# Patient Record
Sex: Male | Born: 1975
Health system: Southern US, Community
[De-identification: ages and names within clinical notes are randomized; demographics above are authoritative.]

## PROBLEM LIST (undated history)

## (undated) HISTORY — PX: COLON SURGERY: SHX602

---

## 2009-12-29 ENCOUNTER — Encounter: Admission: RE | Admit: 2009-12-29 | Discharge: 2009-12-29 | Payer: Self-pay | Admitting: Internal Medicine
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
## 2013-04-22 ENCOUNTER — Encounter (HOSPITAL_COMMUNITY): Payer: Self-pay | Admitting: Emergency Medicine

## 2013-04-22 ENCOUNTER — Emergency Department (HOSPITAL_COMMUNITY)
Admission: EM | Admit: 2013-04-22 | Discharge: 2013-04-22 | Disposition: A | Discharge status: Home / Self Care | Payer: 59 | Attending: Emergency Medicine | Admitting: Emergency Medicine

## 2013-04-22 DIAGNOSIS — Y9389 Activity, other specified: Secondary | ICD-10-CM | POA: Insufficient documentation

## 2013-04-22 DIAGNOSIS — S335XXA Sprain of ligaments of lumbar spine, initial encounter: Principal | ICD-10-CM

## 2013-04-22 DIAGNOSIS — S39012A Strain of muscle, fascia and tendon of lower back, initial encounter: Secondary | ICD-10-CM

## 2013-04-22 DIAGNOSIS — Z87891 Personal history of nicotine dependence: Secondary | ICD-10-CM | POA: Insufficient documentation

## 2013-04-22 DIAGNOSIS — Y929 Unspecified place or not applicable: Secondary | ICD-10-CM | POA: Insufficient documentation

## 2013-04-22 DIAGNOSIS — S339XXA Sprain of unspecified parts of lumbar spine and pelvis, initial encounter: Secondary | ICD-10-CM | POA: Insufficient documentation

## 2013-04-22 DIAGNOSIS — X500XXA Overexertion from strenuous movement or load, initial encounter: Secondary | ICD-10-CM | POA: Insufficient documentation

## 2013-04-22 MED ORDER — KETOROLAC TROMETHAMINE 30 MG/ML IJ SOLN
30.0000 mg | Freq: Once | INTRAMUSCULAR | Status: AC
  Administered 2013-04-22: 30 mg via INTRAMUSCULAR
  Filled 2013-04-22: qty 1

## 2013-04-22 MED ORDER — OXYCODONE-ACETAMINOPHEN 5-325 MG PO TABS: 1.0000 | ORAL_TABLET | Freq: Four times a day (QID) | ORAL | Status: DC | PRN

## 2013-04-22 MED ORDER — IBUPROFEN 200 MG PO TABS: 600.0000 mg | ORAL_TABLET | Freq: Four times a day (QID) | ORAL | Status: AC | PRN

## 2013-04-22 MED ORDER — OXYCODONE-ACETAMINOPHEN 5-325 MG PO TABS
1.0000 | ORAL_TABLET | Freq: Once | ORAL | Status: AC
  Administered 2013-04-22: 1 via ORAL
  Filled 2013-04-22: qty 1

## 2013-04-22 NOTE — ED Notes (Signed)
Patient states he was putting on boots when he noticed an "electric pain" and had to stop.  States he has difficulty finding a comfortable position.  Denies bowel bladder problems and denies radiating pain down either leg.

## 2013-04-22 NOTE — Discharge Instructions (Signed)
Back Pain, Adult Low back pain is very common. About 1 in 5 people have back pain.The cause of low back pain is rarely dangerous. The pain often gets better over time.About half of people with a sudden onset of back pain feel better in just 2 weeks. About 8 in 10 people feel better by 6 weeks.  CAUSES Some common causes of back pain include:  Strain of the muscles or ligaments supporting the spine.  Wear and tear (degeneration) of the spinal discs.  Arthritis.  Direct injury to the back. DIAGNOSIS Most of the time, the direct cause of low back pain is not known.However, back pain can be treated effectively even when the exact cause of the pain is unknown.Answering your caregiver's questions about your overall health and symptoms is one of the most accurate ways to make sure the cause of your pain is not dangerous. If your caregiver needs more information, he or she may order lab work or imaging tests (X-rays or MRIs).However, even if imaging tests show changes in your back, this usually does not require surgery. HOME CARE INSTRUCTIONS For many people, back pain returns.Since low back pain is rarely dangerous, it is often a condition that people can learn to manageon their own.   Remain active. It is stressful on the back to sit or stand in one place. Do not sit, drive, or stand in one place for more than 30 minutes at a time. Take short walks on level surfaces as soon as pain allows.Try to increase the length of time you walk each day.  Do not stay in bed.Resting more than 1 or 2 days can delay your recovery.  Do not avoid exercise or work.Your body is made to move.It is not dangerous to be active, even though your back may hurt.Your back will likely heal faster if you return to being active before your pain is gone.  Pay attention to your body when you bend and lift. Many people have less discomfortwhen lifting if they bend their knees, keep the load close to their bodies,and  avoid twisting. Often, the most comfortable positions are those that put less stress on your recovering back.  Find a comfortable position to sleep. Use a firm mattress and lie on your side with your knees slightly bent. If you lie on your back, put a pillow under your knees.  Only take over-the-counter or prescription medicines as directed by your caregiver. Over-the-counter medicines to reduce pain and inflammation are often the most helpful.Your caregiver may prescribe muscle relaxant drugs.These medicines help dull your pain so you can more quickly return to your normal activities and healthy exercise.  Put ice on the injured area.  Put ice in a plastic bag.  Place a towel between your skin and the bag.  Leave the ice on for 15-20 minutes, 03-04 times a day for the first 2 to 3 days. After that, ice and heat may be alternated to reduce pain and spasms.  Ask your caregiver about trying back exercises and gentle massage. This may be of some benefit.  Avoid feeling anxious or stressed.Stress increases muscle tension and can worsen back pain.It is important to recognize when you are anxious or stressed and learn ways to manage it.Exercise is a great option. SEEK MEDICAL CARE IF:  You have pain that is not relieved with rest or medicine.  You have pain that does not improve in 1 week.  You have new symptoms.  You are generally not feeling well. SEEK   IMMEDIATE MEDICAL CARE IF:   You have pain that radiates from your back into your legs.  You develop new bowel or bladder control problems.  You have unusual weakness or numbness in your arms or legs.  You develop nausea or vomiting.  You develop abdominal pain.  You feel faint. Document Released: 03/26/2005 Document Revised: 09/25/2011 Document Reviewed: 08/14/2010 Exit[employer] Patient Information 2014 [employer]. Back Exercises Back exercises help treat and prevent back injuries. The goal of back exercises is to increase  the strength of your abdominal and back muscles and the flexibility of your back. These exercises should be started when you no longer have back pain. Back exercises include:  Pelvic Tilt. Lie on your back with your knees bent. Tilt your pelvis until the lower part of your back is against the floor. Hold this position 5 to 10 sec and repeat 5 to 10 times.  Knee to Chest. Pull first 1 knee up against your chest and hold for 20 to 30 seconds, repeat this with the other knee, and then both knees. This may be done with the other leg straight or bent, whichever feels better.  Sit-Ups or Curl-Ups. Bend your knees 90 degrees. Start with tilting your pelvis, and do a partial, slow sit-up, lifting your trunk only 30 to 45 degrees off the floor. Take at least 2 to 3 seconds for each sit-up. Do not do sit-ups with your knees out straight. If partial sit-ups are difficult, simply do the above but with only tightening your abdominal muscles and holding it as directed.  Hip-Lift. Lie on your back with your knees flexed 90 degrees. Push down with your feet and shoulders as you raise your hips a couple inches off the floor; hold for 10 seconds, repeat 5 to 10 times.  Back arches. Lie on your stomach, propping yourself up on bent elbows. Slowly press on your hands, causing an arch in your low back. Repeat 3 to 5 times. Any initial stiffness and discomfort should lessen with repetition over time.  Shoulder-Lifts. Lie face down with arms beside your body. Keep hips and torso pressed to floor as you slowly lift your head and shoulders off the floor. Do not overdo your exercises, especially in the beginning. Exercises may cause you some mild back discomfort which lasts for a few minutes; however, if the pain is more severe, or lasts for more than 15 minutes, do not continue exercises until you see your caregiver. Improvement with exercise therapy for back problems is slow.  See your caregivers for assistance with  developing a proper back exercise program. Document Released: 05/03/2004 Document Revised: 06/18/2011 Document Reviewed: 01/25/2011 Exit[employer] Patient Information 2014 [employer].  

## 2013-04-22 NOTE — ED Notes (Signed)
Dr. Horton at bedside. 

## 2013-04-22 NOTE — ED Notes (Signed)
Patient states he was putting on boots yesterday morning and noticed a sharp pain in the middle of his lower back around the belt line.  Patient was now has difficulty walking and with other normal activities.

## 2013-04-22 NOTE — ED Provider Notes (Signed)
CSN: [id number]     Arrival date & time 04/22/13  0702 History   First MD Initiated Contact with Patient 04/22/13 0710     Chief Complaint  Patient presents with  . Back Pain   (Consider location/radiation/quality/duration/timing/severity/associated sxs/prior Treatment) HPI  This a 38 year old male with no significant past medical history who presents with back pain. Patient states that he was pulling on his boots yesterday morning when he felt a sharp pain in his lower back. Since that time he has had worsening pain. The pain is worse with movement. He locates the pain over his lower back. He denies any radiation of pain. He denies any dark or bladder problems. He reports pain with ambulation but no difficulty ambulating.  He has taken ibuprofen without relief.  He denies any fevers, IV drug use, recent steroid use.  History reviewed. No pertinent past medical history. Past Surgical History  Procedure Laterality Date  . Colon surgery      when he was a neonate   No family history on file. History  Substance Use Topics  . Smoking status: Former Smoker    Types: Cigarettes  . Smokeless tobacco: Not on file  . Alcohol Use: No    Review of Systems  Constitutional: Negative.  Negative for fever.  Respiratory: Negative.  Negative for chest tightness and shortness of breath.   Cardiovascular: Negative.  Negative for chest pain.  Gastrointestinal: Negative.  Negative for abdominal pain.  Genitourinary: Negative.        Denies urinary retention  Musculoskeletal: Positive for back pain. Negative for gait problem.  Skin: Negative for rash.  Neurological: Negative for weakness, numbness and headaches.  All other systems reviewed and are negative.    Allergies  Codeine  Home Medications   Current Outpatient Rx  Name  Route  Sig  Dispense  Refill  . ibuprofen (ADVIL,MOTRIN) 200 MG tablet   Oral   Take 3 tablets (600 mg total) by mouth every 6 (six) hours as needed for headache or  cramping.   30 tablet   0   . oxyCODONE-acetaminophen (PERCOCET/ROXICET) 5-325 MG per tablet   Oral   Take 1 tablet by mouth every 6 (six) hours as needed for severe pain.   15 tablet   0    BP 130/80  Pulse 75  Temp(Src) 97.9 F (36.6 C) (Oral)  Resp 20  Ht 5\' 7"  (1.702 m)  Wt 185 lb (83.915 kg)  BMI 28.97 kg/m2  SpO2 100% Physical Exam  Nursing note and vitals reviewed. Constitutional: He is oriented to person, place, and time. No distress.  Uncomfortable appearing  HENT:  Head: Normocephalic and atraumatic.  Cardiovascular: Normal rate, regular rhythm and normal heart sounds.   No murmur heard. Pulmonary/Chest: Effort normal and breath sounds normal. No respiratory distress. He has no wheezes.  Abdominal: Soft. Bowel sounds are normal. There is no tenderness. There is no rebound.  Musculoskeletal: He exhibits no edema.  No midline tenderness to palpation  Lymphadenopathy:    He has no cervical adenopathy.  Neurological: He is alert and oriented to person, place, and time. He displays normal reflexes.  5 out of 5 strength in all 4 extremities  Skin: Skin is warm and dry.  Psychiatric: He has a normal mood and affect.    ED Course  Procedures (including critical care time) Labs Review Labs Reviewed - No data to display Imaging Review No results found.  EKG Interpretation   None  MDM   1. Lumbosacral strain    Patient presents with back pain. Patient had onset of pain while pulling on his speech yesterday morning. He is nontoxic-appearing. He has no red flags back pain. He is nontender on exam. Neurologic exam is normal.  Given that his pain is worse with movement, suspect lumbosacral strain.  Patient was given Percocet and Toradol. He was encouraged to continue ibuprofen at home and I will give him a short course of narcotic pain medication. He is to followup with his primary physician.  After history, exam, and medical workup I feel the patient has  been appropriately medically screened and is safe for discharge home. Pertinent diagnoses were discussed with the patient. Patient was given return precautions.     Shon Baton, MD 04/22/13 [phone]

## 2016-05-16 ENCOUNTER — Ambulatory Visit (HOSPITAL_COMMUNITY)
Admission: EM | Admit: 2016-05-16 | Discharge: 2016-05-16 | Disposition: A | Discharge status: Home / Self Care | Payer: Commercial Managed Care - HMO | Attending: Family Medicine | Admitting: Family Medicine

## 2016-05-16 ENCOUNTER — Encounter (HOSPITAL_COMMUNITY): Payer: Self-pay | Admitting: Family Medicine

## 2016-05-16 DIAGNOSIS — M10071 Idiopathic gout, right ankle and foot: Secondary | ICD-10-CM

## 2016-05-16 MED ORDER — INDOMETHACIN 50 MG PO CAPS: 50.0000 mg | ORAL_CAPSULE | Freq: Three times a day (TID) | ORAL | 3 refills | Status: AC

## 2016-05-16 MED ORDER — PREDNISONE 20 MG PO TABS: ORAL_TABLET | ORAL | 3 refills | Status: DC

## 2016-05-16 NOTE — ED Triage Notes (Signed)
Pt here for right big toe pain and swelling.

## 2016-05-16 NOTE — ED Provider Notes (Signed)
[facility]    CSN: [id number] Arrival date & time: 05/16/16  1105     History   Chief Complaint Chief Complaint  Patient presents with  . Toe Pain    HPI Clarence Small is a 41 y.o. male.   [name] with right great toe pain. The problem has been present for about 4 days. Is characterized by pain on ambulation, redness and swelling in the PIP joint of the right great toe  Patient works for [employer]  Father's and alcoholic  Family history is positive for rheumatoid arthritis      History reviewed. No pertinent past medical history.  There are no active problems to display for this patient.   Past Surgical History:  Procedure Laterality Date  . COLON SURGERY     when he was a neonate       Home Medications    Prior to Admission medications   Medication Sig Start Date End Date Taking? Authorizing Provider  ibuprofen (ADVIL,MOTRIN) 200 MG tablet Take 3 tablets (600 mg total) by mouth every 6 (six) hours as needed for headache or cramping. 04/22/13   Shon Batonourtney F Horton, MD  indomethacin (INDOCIN) 50 MG capsule Take 1 capsule (50 mg total) by mouth 3 (three) times daily with meals. 05/16/16   Elvina SidleKurt Brie Eppard, MD  predniSONE (DELTASONE) 20 MG tablet Two daily with food 05/16/16   Elvina SidleKurt Lella Mullany, MD    Family History History reviewed. No pertinent family history.  Social History Social History  Substance Use Topics  . Smoking status: Former Smoker    Types: Cigarettes  . Smokeless tobacco: Never Used  . Alcohol use No     Allergies   Codeine   Review of Systems Review of Systems  Constitutional: Negative.   Musculoskeletal: Positive for gait problem and joint swelling.     Physical Exam Triage Vital Signs ED Triage Vitals [05/16/16 1214]  Enc Vitals Group     BP 144/81     Pulse Rate 86     Resp 18     Temp 98.2 F (36.8 C)     Temp src      SpO2 98 %     Weight      Height      Head  Circumference      Peak Flow      Pain Score      Pain Loc      Pain Edu?      Excl. in GC?    No data found.   Updated Vital Signs BP 144/81   Pulse 86   Temp 98.2 F (36.8 C)   Resp 18   SpO2 98%    Physical Exam  Constitutional: He is oriented to person, place, and time. He appears well-developed and well-nourished.  HENT:  Right Ear: External ear normal.  Left Ear: External ear normal.  Mouth/Throat: Oropharynx is clear and moist.  Eyes: Conjunctivae are normal. Pupils are equal, round, and reactive to light.  Neck: Normal range of motion. Neck supple.  Pulmonary/Chest: Effort normal.  Musculoskeletal:  Right great toe erythema, swelling, tenderness  Neurological: He is alert and oriented to person, place, and time.  Skin: Skin is warm. There is erythema.  Nursing note and vitals reviewed.    UC Treatments / Results  Labs (all labs ordered are listed, but only abnormal results are displayed) Labs Reviewed - No data to display  EKG  EKG Interpretation None  Radiology No results found.  Procedures Procedures (including critical care time)  Medications Ordered in UC Medications - No data to display   Initial Impression / Assessment and Plan / UC Course  I have reviewed the triage vital signs and the nursing notes.  Pertinent labs & imaging results that were available during my care of the patient were reviewed by me and considered in my medical decision making (see chart for details).     Final Clinical Impressions(s) / UC Diagnoses   Final diagnoses:  Acute idiopathic gout involving toe of right foot    New Prescriptions New Prescriptions   INDOMETHACIN (INDOCIN) 50 MG CAPSULE    Take 1 capsule (50 mg total) by mouth 3 (three) times daily with meals.   PREDNISONE (DELTASONE) 20 MG TABLET    Two daily with food     Elvina Sidle, MD 05/16/16 1249

## 2016-08-23 DIAGNOSIS — M109 Gout, unspecified: Secondary | ICD-10-CM | POA: Diagnosis not present

## 2016-08-23 DIAGNOSIS — Z1321 Encounter for screening for nutritional disorder: Secondary | ICD-10-CM | POA: Diagnosis not present

## 2016-08-23 DIAGNOSIS — Z0000 Encounter for general adult medical examination without abnormal findings: Secondary | ICD-10-CM | POA: Diagnosis not present

## 2016-08-29 DIAGNOSIS — Z0000 Encounter for general adult medical examination without abnormal findings: Secondary | ICD-10-CM | POA: Diagnosis not present

## 2018-06-21 ENCOUNTER — Encounter (HOSPITAL_COMMUNITY): Payer: Self-pay | Admitting: Family Medicine

## 2018-06-21 ENCOUNTER — Ambulatory Visit (HOSPITAL_COMMUNITY)
Admission: EM | Admit: 2018-06-21 | Discharge: 2018-06-21 | Disposition: A | Discharge status: Home / Self Care | Payer: 59 | Attending: Family Medicine | Admitting: Family Medicine

## 2018-06-21 ENCOUNTER — Other Ambulatory Visit: Payer: Self-pay

## 2018-06-21 DIAGNOSIS — S39012A Strain of muscle, fascia and tendon of lower back, initial encounter: Secondary | ICD-10-CM

## 2018-06-21 MED ORDER — CYCLOBENZAPRINE HCL 10 MG PO TABS: 10.0000 mg | ORAL_TABLET | Freq: Two times a day (BID) | ORAL | 0 refills | Status: AC | PRN

## 2018-06-21 MED ORDER — PREDNISONE 50 MG PO TABS: ORAL_TABLET | ORAL | 1 refills | Status: AC
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
## 2018-06-21 NOTE — Discharge Instructions (Signed)
Please return in 4 days unless your back is completely better.  I recommend a training program for your back muscles

## 2018-06-21 NOTE — ED Triage Notes (Signed)
Pt cc back pain this started this morning as he was getting out of his car. Pt states this is constant stiffness.

## 2019-05-29 ENCOUNTER — Ambulatory Visit: Payer: 59 | Attending: Internal Medicine

## 2019-05-29 DIAGNOSIS — Z23 Encounter for immunization: Secondary | ICD-10-CM

## 2019-05-29 NOTE — Progress Notes (Signed)
   Covid-19 Vaccination Clinic  Name:  Clarence Small    MRN: [id number] DOB: Mar 17, 1976  05/29/2019  [name] was observed post Covid-19 immunization for 15 minutes without incidence. He was provided with Vaccine Information Sheet and instruction to access the V-Safe system.   [name] was instructed to call 911 with any severe reactions post vaccine: Marland Kitchen Difficulty breathing  . Swelling of your face and throat  . A fast heartbeat  . A bad rash all over your body  . Dizziness and weakness    Immunizations Administered    Name Date Dose VIS Date Route   Pfizer COVID-19 Vaccine 05/29/2019 11:33 AM 0.3 mL 03/20/2019 Intramuscular   Manufacturer: [employer], [employer]   Lot: [id number]   NDC: [address]-2

## 2019-06-23 ENCOUNTER — Ambulatory Visit: Payer: 59 | Attending: Internal Medicine

## 2019-06-23 DIAGNOSIS — Z23 Encounter for immunization: Secondary | ICD-10-CM

## 2019-06-23 NOTE — Progress Notes (Signed)
   Covid-19 Vaccination Clinic  Name:  Clarence Small    MRN: [id number] DOB: 09/15/1975  06/23/2019  Clarence Small was observed post Covid-19 immunization for 15 minutes without incident. He was provided with Vaccine Information Sheet and instruction to access the V-Safe system.   Clarence Small was instructed to call 911 with any severe reactions post vaccine: Marland Kitchen Difficulty breathing  . Swelling of face and throat  . A fast heartbeat  . A bad rash all over body  . Dizziness and weakness   Immunizations Administered    Name Date Dose VIS Date Route   Pfizer COVID-19 Vaccine 06/23/2019  8:18 AM 0.3 mL 03/20/2019 Intramuscular   Manufacturer: [employer], [employer]   Lot: [id number]   NDC: [address]-1
